# Patient Record
Sex: Male | Born: 1995 | Race: White | Hispanic: No | Marital: Single | Smoking: Never smoker
Health system: Southern US, Community
[De-identification: ages and names within clinical notes are randomized; demographics above are authoritative.]

---
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
## 2016-04-22 DIAGNOSIS — D225 Melanocytic nevi of trunk: Secondary | ICD-10-CM | POA: Diagnosis not present

## 2016-07-08 ENCOUNTER — Ambulatory Visit
Admission: RE | Admit: 2016-07-08 | Discharge: 2016-07-08 | Disposition: A | Discharge status: Home / Self Care | Payer: No Typology Code available for payment source | Source: Ambulatory Visit | Attending: Occupational Medicine | Admitting: Occupational Medicine

## 2016-07-08 ENCOUNTER — Other Ambulatory Visit: Payer: Self-pay | Admitting: Occupational Medicine

## 2016-07-08 DIAGNOSIS — Z021 Encounter for pre-employment examination: Secondary | ICD-10-CM

## 2016-11-27 ENCOUNTER — Other Ambulatory Visit: Payer: Self-pay

## 2016-11-27 ENCOUNTER — Encounter (HOSPITAL_COMMUNITY): Payer: Self-pay | Admitting: Emergency Medicine

## 2016-11-27 ENCOUNTER — Ambulatory Visit (HOSPITAL_COMMUNITY)
Admission: EM | Admit: 2016-11-27 | Discharge: 2016-11-27 | Disposition: A | Discharge status: Home / Self Care | Payer: 59

## 2016-11-27 DIAGNOSIS — J40 Bronchitis, not specified as acute or chronic: Secondary | ICD-10-CM

## 2016-11-27 MED ORDER — BENZONATATE 100 MG PO CAPS: 100.0000 mg | ORAL_CAPSULE | Freq: Three times a day (TID) | ORAL | 0 refills | Status: AC

## 2016-11-27 MED ORDER — AZITHROMYCIN 250 MG PO TABS: 250.0000 mg | ORAL_TABLET | Freq: Every day | ORAL | 0 refills | Status: AC

## 2016-11-27 MED ORDER — CETIRIZINE-PSEUDOEPHEDRINE ER 5-120 MG PO TB12: 1.0000 | ORAL_TABLET | Freq: Every day | ORAL | 0 refills | Status: AC

## 2016-11-27 MED ORDER — FLUTICASONE PROPIONATE 50 MCG/ACT NA SUSP: 2.0000 | Freq: Every day | NASAL | 0 refills | Status: AC

## 2016-11-27 NOTE — Discharge Instructions (Signed)
Azithromycin as directed. Tessalon for cough. Start flonase, zyrtec-D for nasal congestion. You can use over the counter nasal saline rinse such as neti pot for nasal congestion. Keep hydrated, your urine should be clear to pale yellow in color. Tylenol/motrin for fever and pain. Monitor for any worsening of symptoms, chest pain, shortness of breath, wheezing, swelling of the throat, follow up for reevaluation.   For sore throat try using a honey-based tea. Use 3 teaspoons of honey with juice squeezed from half lemon. Place shaved pieces of ginger into 1/2-1 cup of water and warm over stove top. Then mix the ingredients and repeat every 4 hours as needed.

## 2016-11-27 NOTE — ED Triage Notes (Signed)
Onset one week ago of symptoms.  Chest congestion, coughing up phlegm, general aches, and head pressure, chills.

## 2016-11-27 NOTE — ED Provider Notes (Signed)
[facility]    CSN: [id number] Arrival date & time: 11/27/16  1208     History   Chief Complaint Chief Complaint  Patient presents with  . URI    HPI Kevin Hicks is a 21 y.o. male.   21 year old male comes in with 1 week history of URI symptoms. Chest congestion, productive cough, generalized body aches, sinus pressure, chills. Denies fever, chills, night sweats. States has had a sore throat the past couple of days he associates with worsening cough. Productive cough worse at night and first thing in the morning. Does continue to have nasal congestion and rhinorrhea. Has had some wheezing, used family member's albuterol with good relief. Never smoker. Denies history of asthma.       History reviewed. No pertinent past medical history.  There are no active problems to display for this patient.   History reviewed. No pertinent surgical history.     Home Medications    Prior to Admission medications   Medication Sig Start Date End Date Taking? Authorizing Provider  Multiple Vitamin (MULTIVITAMIN) tablet Take 1 tablet by mouth daily.   Yes [provider]  azithromycin (ZITHROMAX) 250 MG tablet Take 1 tablet (250 mg total) by mouth daily. Take first 2 tablets together, then 1 every day until finished. 11/27/16   Tasia Catchings, Amy V, PA-C  benzonatate (TESSALON) 100 MG capsule Take 1 capsule (100 mg total) by mouth every 8 (eight) hours. 11/27/16   Tasia Catchings, Amy V, PA-C  cetirizine-pseudoephedrine (ZYRTEC-D) 5-120 MG tablet Take 1 tablet by mouth daily. 11/27/16   Tasia Catchings, Amy V, PA-C  fluticasone (FLONASE) 50 MCG/ACT nasal spray Place 2 sprays into both nostrils daily. 11/27/16   Ok Edwards, PA-C    Family History Family History  Problem Relation Age of Onset  . Congestive Heart Failure Mother   . Diabetes Mother   . Hypertension Mother   . Hypertension Father     Social History Social History   Tobacco Use  . Smoking status: Never Smoker  Substance Use  Topics  . Alcohol use: Yes  . Drug use: No     Allergies   Sulfa antibiotics   Review of Systems Review of Systems  Reason unable to perform ROS: See HPI as above.     Physical Exam Triage Vital Signs ED Triage Vitals  Enc Vitals Group     BP 11/27/16 1250 130/66     Pulse Rate 11/27/16 1250 66     Resp 11/27/16 1250 18     Temp 11/27/16 1250 98.6 F (37 C)     Temp Source 11/27/16 1250 Oral     SpO2 11/27/16 1250 99 %     Weight --      Height --      Head Circumference --      Peak Flow --      Pain Score 11/27/16 1247 2     Pain Loc --      Pain Edu? --      Excl. in [place]? --    No data found.  Updated Vital Signs BP 130/66 (BP Location: Left Arm)   Pulse 66   Temp 98.6 F (37 C) (Oral)   Resp 18   SpO2 99%   Physical Exam  Constitutional: He is oriented to person, place, and time. He appears well-developed and well-nourished. No distress.  HENT:  Head: Normocephalic and atraumatic.  Right Ear: Tympanic membrane, external ear and ear canal normal.  Tympanic membrane is not erythematous and not bulging.  Left Ear: External ear and ear canal normal. Tympanic membrane is erythematous. Tympanic membrane is not bulging.  Nose: Mucosal edema and rhinorrhea present. Right sinus exhibits no maxillary sinus tenderness and no frontal sinus tenderness. Left sinus exhibits no maxillary sinus tenderness and no frontal sinus tenderness.  Mouth/Throat: Uvula is midline, oropharynx is clear and moist and mucous membranes are normal.  Eyes: Conjunctivae are normal. Pupils are equal, round, and reactive to light.  Neck: Normal range of motion. Neck supple.  Cardiovascular: Normal rate, regular rhythm and normal heart sounds. Exam reveals no gallop and no friction rub.  No murmur heard. Pulmonary/Chest: Effort normal and breath sounds normal. He has no decreased breath sounds. He has no wheezes. He has no rhonchi. He has no rales.  Lymphadenopathy:    He has no cervical  adenopathy.  Neurological: He is alert and oriented to person, place, and time.  Skin: Skin is warm and dry.  Psychiatric: He has a normal mood and affect. His behavior is normal. Judgment normal.     UC Treatments / Results  Labs (all labs ordered are listed, but only abnormal results are displayed) Labs Reviewed - No data to display  EKG  EKG Interpretation None       Radiology No results found.  Procedures Procedures (including critical care time)  Medications Ordered in UC Medications - No data to display   Initial Impression / Assessment and Plan / UC Course  I have reviewed the triage vital signs and the nursing notes.  Pertinent labs & imaging results that were available during my care of the patient were reviewed by me and considered in my medical decision making (see chart for details).    Start azithromycin as directed. Other symptomatic treatment discussed. Return precautions given.   Final Clinical Impressions(s) / UC Diagnoses   Final diagnoses:  Bronchitis    ED Discharge Orders        Ordered    azithromycin (ZITHROMAX) 250 MG tablet  Daily     11/27/16 1327    fluticasone (FLONASE) 50 MCG/ACT nasal spray  Daily     11/27/16 1327    cetirizine-pseudoephedrine (ZYRTEC-D) 5-120 MG tablet  Daily     11/27/16 1327    benzonatate (TESSALON) 100 MG capsule  Every 8 hours     11/27/16 1327        Ok Edwards, PA-C 11/27/16 1331

## 2017-03-17 DIAGNOSIS — R030 Elevated blood-pressure reading, without diagnosis of hypertension: Secondary | ICD-10-CM | POA: Diagnosis not present

## 2017-03-17 DIAGNOSIS — Z6824 Body mass index (BMI) 24.0-24.9, adult: Secondary | ICD-10-CM | POA: Diagnosis not present

## 2017-06-21 DIAGNOSIS — Z0000 Encounter for general adult medical examination without abnormal findings: Secondary | ICD-10-CM | POA: Diagnosis not present

## 2017-07-11 DIAGNOSIS — Z6824 Body mass index (BMI) 24.0-24.9, adult: Secondary | ICD-10-CM | POA: Diagnosis not present

## 2017-07-11 DIAGNOSIS — R030 Elevated blood-pressure reading, without diagnosis of hypertension: Secondary | ICD-10-CM | POA: Diagnosis not present

## 2017-08-04 ENCOUNTER — Other Ambulatory Visit: Payer: Self-pay

## 2017-08-04 ENCOUNTER — Emergency Department (HOSPITAL_COMMUNITY)
Admission: EM | Admit: 2017-08-04 | Discharge: 2017-08-04 | Disposition: A | Discharge status: Home / Self Care | Payer: 59 | Attending: Emergency Medicine | Admitting: Emergency Medicine

## 2017-08-04 ENCOUNTER — Encounter (HOSPITAL_COMMUNITY): Payer: Self-pay

## 2017-08-04 DIAGNOSIS — Y939 Activity, unspecified: Secondary | ICD-10-CM | POA: Diagnosis not present

## 2017-08-04 DIAGNOSIS — Y929 Unspecified place or not applicable: Secondary | ICD-10-CM | POA: Diagnosis not present

## 2017-08-04 DIAGNOSIS — Z23 Encounter for immunization: Secondary | ICD-10-CM

## 2017-08-04 DIAGNOSIS — Z79899 Other long term (current) drug therapy: Secondary | ICD-10-CM | POA: Diagnosis not present

## 2017-08-04 DIAGNOSIS — W540XXA Bitten by dog, initial encounter: Secondary | ICD-10-CM | POA: Diagnosis not present

## 2017-08-04 DIAGNOSIS — Y999 Unspecified external cause status: Secondary | ICD-10-CM | POA: Diagnosis not present

## 2017-08-04 DIAGNOSIS — S80871A Other superficial bite, right lower leg, initial encounter: Secondary | ICD-10-CM | POA: Insufficient documentation

## 2017-08-04 DIAGNOSIS — S81851D Open bite, right lower leg, subsequent encounter: Secondary | ICD-10-CM | POA: Diagnosis not present

## 2017-08-04 DIAGNOSIS — M791 Myalgia, unspecified site: Secondary | ICD-10-CM | POA: Diagnosis not present

## 2017-08-04 MED ORDER — RABIES VACCINE, PCEC IM SUSR
1.0000 mL | Freq: Once | INTRAMUSCULAR | Status: AC
Start: 2017-08-04 — End: 2017-08-04
  Administered 2017-08-04: 1 mL via INTRAMUSCULAR
  Filled 2017-08-04: qty 1

## 2017-08-04 MED ORDER — RABIES IMMUNE GLOBULIN 150 UNIT/ML IM INJ
20.0000 [IU]/kg | INJECTION | Freq: Once | INTRAMUSCULAR | Status: AC
  Administered 2017-08-04: 1500 [IU] via INTRAMUSCULAR
  Filled 2017-08-04: qty 10

## 2017-08-04 NOTE — ED Provider Notes (Signed)
Kevin Hicks Provider Note   CSN: [id number] Arrival date & time: 08/04/17  1027     History   Chief Complaint Chief Complaint  Patient presents with  . Rabies Injection    HPI [name] is a 22 y.o. male.  22 year old male presents with report of dog bite to the right lower leg.  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  Patient was wearing shorts at the time, states that he kicked the dog and the dog ran away.  Patient reports mostly bruising to the area however he did have a small amount of bleeding, no obvious puncture wounds.  Patient's tetanus is up-to-date.  Patient contacted animal control who attempted to locate the dog however the owner reported that he sold of the dog.  No other injuries, complaints, concerns.  Patient is immunocompetent.     History reviewed. No pertinent past medical history.  There are no active problems to display for this patient.   History reviewed. No pertinent surgical history.      Home Medications    Prior to Admission medications   Medication Sig Start Date End Date Taking? Authorizing Provider  azithromycin (ZITHROMAX) 250 MG tablet Take 1 tablet (250 mg total) by mouth daily. Take first 2 tablets together, then 1 every day until finished. 11/27/16   Tasia Catchings, Amy V, PA-C  benzonatate (TESSALON) 100 MG capsule Take 1 capsule (100 mg total) by mouth every 8 (eight) hours. 11/27/16   Tasia Catchings, Amy V, PA-C  cetirizine-pseudoephedrine (ZYRTEC-D) 5-120 MG tablet Take 1 tablet by mouth daily. 11/27/16   Tasia Catchings, Amy V, PA-C  fluticasone (FLONASE) 50 MCG/ACT nasal spray Place 2 sprays into both nostrils daily. 11/27/16   Tasia Catchings, Amy V, PA-C  Multiple Vitamin (MULTIVITAMIN) tablet Take 1 tablet by mouth daily.    [provider]    Family History Family History  Problem Relation Age of Onset  . Congestive Heart Failure Mother   .  Diabetes Mother   . Hypertension Mother   . Hypertension Father     Social History Social History   Tobacco Use  . Smoking status: Never Smoker  . Smokeless tobacco: Never Used  Substance Use Topics  . Alcohol use: Yes  . Drug use: No     Allergies   Sulfa antibiotics   Review of Systems Review of Systems  Constitutional: Negative for fever.  Musculoskeletal: Positive for myalgias. Negative for arthralgias and joint swelling.  Skin: Positive for wound. Negative for rash.  Allergic/Immunologic: Negative for immunocompromised state.  Neurological: Negative for weakness and numbness.  Hematological: Does not bruise/bleed easily.  Psychiatric/Behavioral: Negative for self-injury.  All other systems reviewed and are negative.    Physical Exam Updated Vital Signs BP 131/81 (BP Location: Right Arm)   Pulse (!) 45   Temp (!) 97.5 F (36.4 C) (Oral)   Ht 5\' 9"  (1.753 m)   Wt 74.8 kg (165 lb)   SpO2 100%   BMI 24.37 kg/m   Physical Exam  Constitutional: He is oriented to person, place, and time. He appears well-developed and well-nourished. No distress.  HENT:  Head: Normocephalic and atraumatic.  Cardiovascular: Intact distal pulses.  Pulmonary/Chest: Effort normal.  Musculoskeletal: He exhibits tenderness. He exhibits no deformity.       Legs: Neurological: He is alert and oriented to person, place, and time.  Skin: Skin is  warm and dry. He is not diaphoretic. No erythema.  Psychiatric: He has a normal mood and affect. His behavior is normal.  Nursing note and vitals reviewed.    ED Treatments / Results  Labs (all labs ordered are listed, but only abnormal results are displayed) Labs Reviewed - No data to display  EKG None  Radiology No results found.  Procedures Procedures (including critical care time)  Medications Ordered in ED Medications  rabies vaccine (RABAVERT) injection 1 mL (has no administration in time range)  rabies immune globulin  (HYPERAB/KEDRAB) injection 1,500 Units (1,500 Units Intramuscular Given 08/04/17 1058)     Initial Impression / Assessment and Plan / ED Course  I have reviewed the triage vital signs and the nursing notes.  Pertinent labs & imaging results that were available during my care of the patient were reviewed by me and considered in my medical decision making (see chart for details).  Clinical Course as of Aug 04 1099  Thu Aug 04, 8245  [id number] 22 year old male with dog bite to the right lower leg occurring 5 days ago, animal control unable to locate the dog.  Patient has a hematoma to the right lower leg posteriorly with a slight area of sloughing of the skin across the center.  Patient states that he had a very small amount of bleeding at the time of the injury.  Patient will be treated with rabies vaccine, day 0 dose given today with schedule for remaining doses to be done through urgent care.  Patient's tetanus is up-to-date.  Patient has never had the rabies series previously.   [LM]    Clinical Course User Index [LM] Tacy Learn, PA-C   Final Clinical Impressions(s) / ED Diagnoses   Final diagnoses:  Need for rabies vaccination  Dog bite, initial encounter    ED Discharge Orders    None       Tacy Learn, PA-C 08/04/17 1101    Julianne Rice, MD 08/06/17 [phone]

## 2017-08-04 NOTE — ED Triage Notes (Signed)
Pt was bit by a dog on Saturday with unknown if records of the dog.

## 2017-08-07 ENCOUNTER — Ambulatory Visit (HOSPITAL_COMMUNITY)
Admission: EM | Admit: 2017-08-07 | Discharge: 2017-08-07 | Disposition: A | Discharge status: Home / Self Care | Payer: 59 | Attending: Family Medicine | Admitting: Family Medicine

## 2017-08-07 DIAGNOSIS — Z23 Encounter for immunization: Secondary | ICD-10-CM | POA: Diagnosis not present

## 2017-08-07 DIAGNOSIS — Z203 Contact with and (suspected) exposure to rabies: Secondary | ICD-10-CM

## 2017-08-07 MED ORDER — RABIES VACCINE, PCEC IM SUSR
1.0000 mL | Freq: Once | INTRAMUSCULAR | Status: AC
  Administered 2017-08-07: 1 mL via INTRAMUSCULAR

## 2017-08-07 MED ORDER — RABIES VACCINE, PCEC IM SUSR
INTRAMUSCULAR | Status: AC
  Filled 2017-08-07: qty 1

## 2017-08-07 NOTE — ED Notes (Signed)
Pt presents for day 3 rabies shot.

## 2017-08-07 NOTE — ED Notes (Signed)
Bed: UC01 Expected date:  Expected time:  Means of arrival:  Comments: 

## 2017-08-10 ENCOUNTER — Ambulatory Visit (HOSPITAL_COMMUNITY)
Admission: EM | Admit: 2017-08-10 | Discharge: 2017-08-10 | Disposition: A | Discharge status: Home / Self Care | Payer: 59 | Attending: Family Medicine | Admitting: Family Medicine

## 2017-08-10 DIAGNOSIS — Z203 Contact with and (suspected) exposure to rabies: Secondary | ICD-10-CM | POA: Diagnosis not present

## 2017-08-10 DIAGNOSIS — Z23 Encounter for immunization: Secondary | ICD-10-CM | POA: Diagnosis not present

## 2017-08-10 MED ORDER — RABIES VACCINE, PCEC IM SUSR
1.0000 mL | Freq: Once | INTRAMUSCULAR | Status: AC
  Administered 2017-08-10: 1 mL via INTRAMUSCULAR

## 2017-08-10 MED ORDER — RABIES VACCINE, PCEC IM SUSR
INTRAMUSCULAR | Status: AC
  Filled 2017-08-10: qty 1

## 2017-08-10 NOTE — ED Triage Notes (Addendum)
Pt here for day 7 rabies vaccine.  Pt denies any issues with the bite.  The bite is healed and no signs of infection.  Pt also noted that they placed his injection in his right arm on the day 3 visit.  We will place his injection in his left arm today.

## 2017-08-10 NOTE — ED Notes (Signed)
Bed: UC01 Expected date:  Expected time:  Means of arrival:  Comments: Appt 

## 2017-08-17 ENCOUNTER — Ambulatory Visit (HOSPITAL_COMMUNITY)
Admission: EM | Admit: 2017-08-17 | Discharge: 2017-08-17 | Disposition: A | Discharge status: Home / Self Care | Payer: 59 | Attending: Family Medicine | Admitting: Family Medicine

## 2017-08-17 DIAGNOSIS — Z203 Contact with and (suspected) exposure to rabies: Secondary | ICD-10-CM

## 2017-08-17 DIAGNOSIS — Z23 Encounter for immunization: Secondary | ICD-10-CM | POA: Diagnosis not present

## 2017-08-17 MED ORDER — RABIES VACCINE, PCEC IM SUSR
INTRAMUSCULAR | Status: AC
  Filled 2017-08-17: qty 1

## 2017-08-17 MED ORDER — RABIES VACCINE, PCEC IM SUSR
1.0000 mL | Freq: Once | INTRAMUSCULAR | Status: AC
  Administered 2017-08-17: 1 mL via INTRAMUSCULAR

## 2017-08-17 NOTE — ED Notes (Signed)
Bed: [facility] Expected date:  Expected time:  Means of arrival:  Comments: Triage 

## 2017-08-17 NOTE — ED Triage Notes (Signed)
Pt here for day 14 rabies vaccine.  Pt denies any issues.

## 2017-09-28 DIAGNOSIS — J019 Acute sinusitis, unspecified: Secondary | ICD-10-CM | POA: Diagnosis not present

## 2017-09-28 DIAGNOSIS — J209 Acute bronchitis, unspecified: Secondary | ICD-10-CM | POA: Diagnosis not present

## 2017-12-12 DIAGNOSIS — J029 Acute pharyngitis, unspecified: Secondary | ICD-10-CM | POA: Diagnosis not present

## 2018-02-20 IMAGING — CR DG CHEST 1V
1 series · 1 of 1 positions shown · non-contrast
Comparison: None.

CLINICAL DATA: Pre-[REDACTED] screening exam. Pt has no
chest complaints at this time.

EXAM:
CHEST 1 VIEW

[w chest pa]
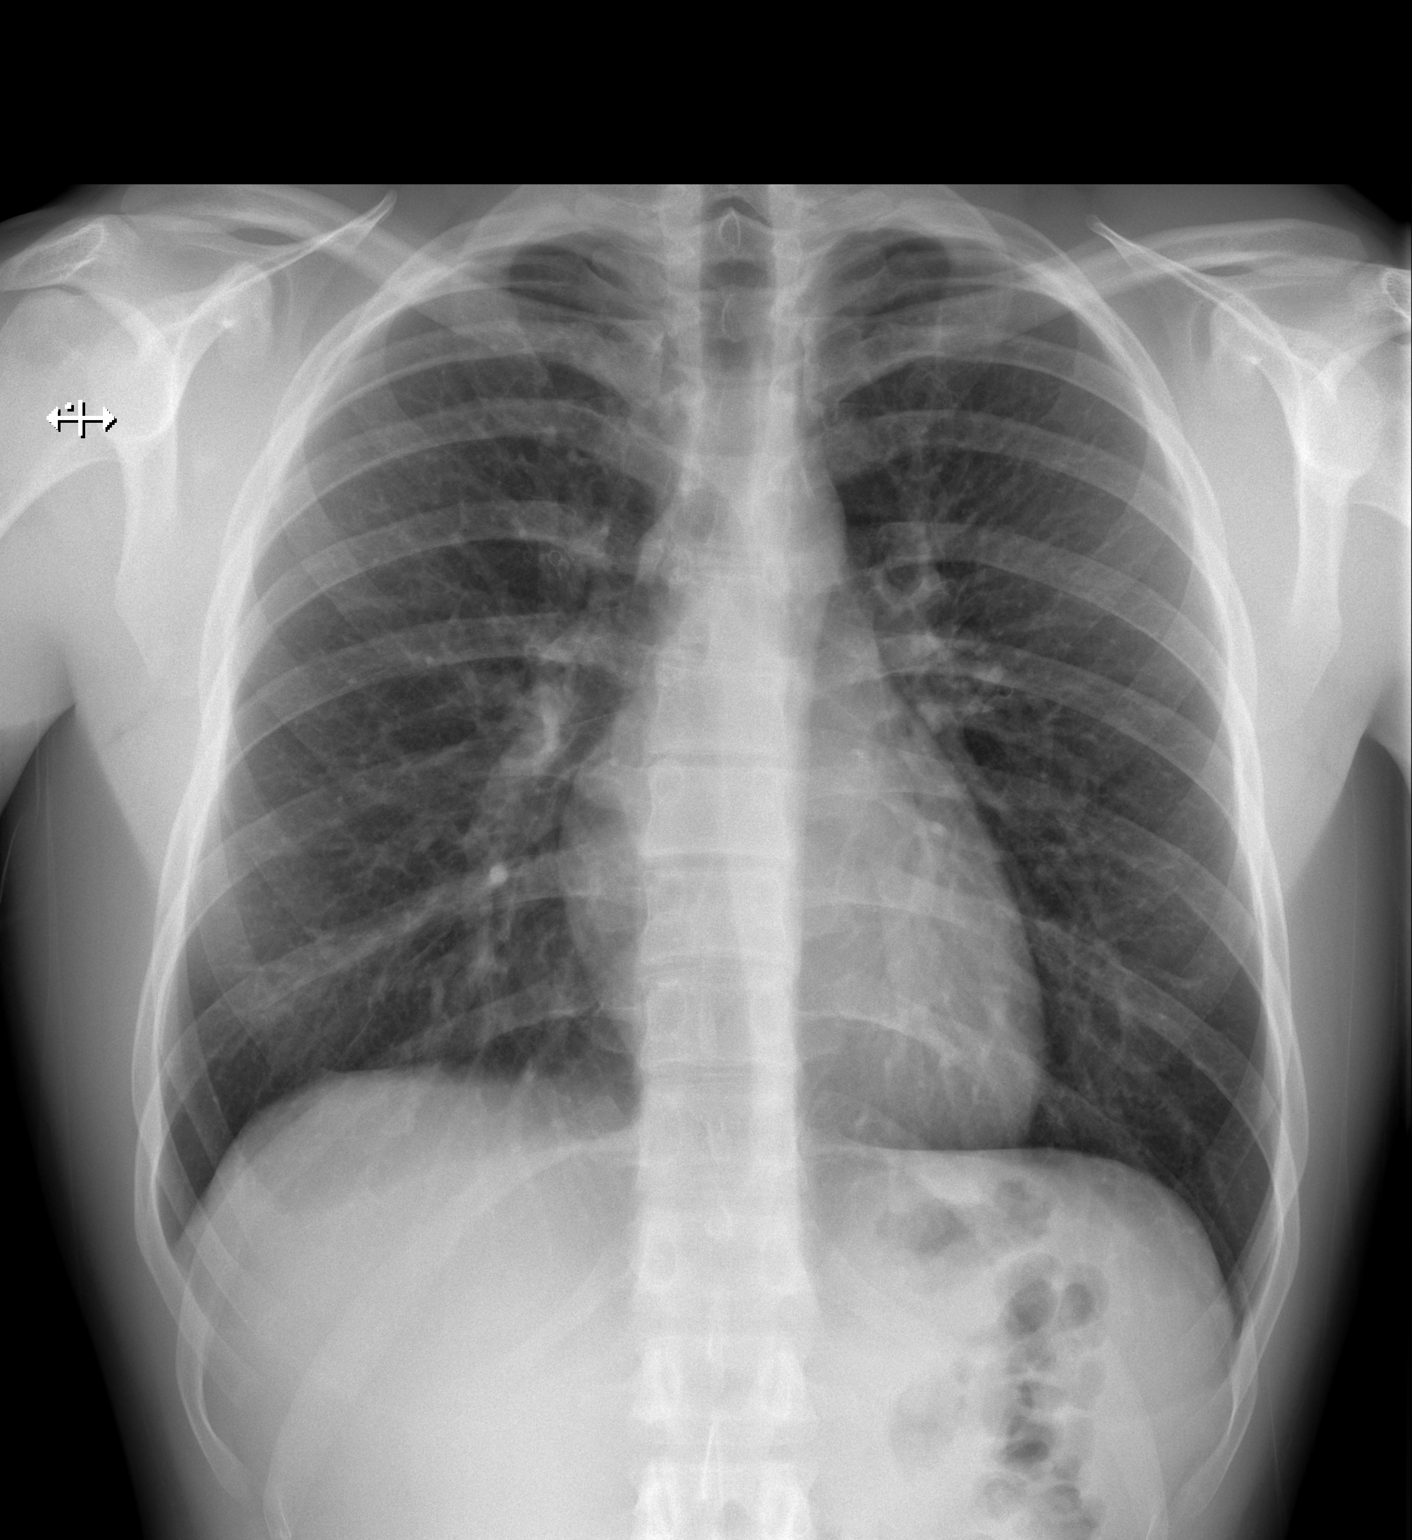

[1 of 1 positions shown; findings below may reference images not displayed]

FINDINGS: The heart size and mediastinal contours are within normal limits.
Both lungs are clear. The visualized skeletal structures are
unremarkable.
IMPRESSION: No active disease.
